# Patient Record
Sex: Female | Born: 1935 | Race: White | Hispanic: No
Health system: Southern US, Community
[De-identification: ages and names within clinical notes are randomized; demographics above are authoritative.]

---

## 2003-12-06 ENCOUNTER — Other Ambulatory Visit: Payer: Self-pay

## 2005-10-14 ENCOUNTER — Emergency Department: Payer: Self-pay | Admitting: Emergency Medicine

## 2005-10-15 ENCOUNTER — Other Ambulatory Visit: Payer: Self-pay

## 2011-06-06 ENCOUNTER — Ambulatory Visit: Payer: Self-pay | Admitting: Family Medicine

## 2011-06-10 ENCOUNTER — Ambulatory Visit: Payer: Self-pay | Admitting: Vascular Surgery

## 2013-03-05 ENCOUNTER — Other Ambulatory Visit: Payer: Self-pay | Admitting: Ophthalmology

## 2013-03-05 LAB — SEDIMENTATION RATE: Erythrocyte Sed Rate: 38 mm/hr — ABNORMAL HIGH (ref 0–30)

## 2013-05-23 ENCOUNTER — Inpatient Hospital Stay: Payer: Self-pay | Admitting: Internal Medicine

## 2013-05-23 LAB — CBC
HCT: 34.4 % — ABNORMAL LOW (ref 35.0–47.0)
HGB: 11.5 g/dL — ABNORMAL LOW (ref 12.0–16.0)
MCH: 28.7 pg (ref 26.0–34.0)
Platelet: 277 10*3/uL (ref 150–440)
RBC: 3.99 10*6/uL (ref 3.80–5.20)
RDW: 14.1 % (ref 11.5–14.5)
WBC: 12.3 10*3/uL — ABNORMAL HIGH (ref 3.6–11.0)

## 2013-05-23 LAB — TROPONIN I: Troponin-I: 0.06 ng/mL — ABNORMAL HIGH

## 2013-05-23 LAB — BASIC METABOLIC PANEL
BUN: 34 mg/dL — ABNORMAL HIGH (ref 7–18)
Chloride: 100 mmol/L (ref 98–107)
EGFR (African American): 34 — ABNORMAL LOW
Osmolality: 270 (ref 275–301)

## 2013-05-24 LAB — BASIC METABOLIC PANEL
BUN: 24 mg/dL — ABNORMAL HIGH (ref 7–18)
Calcium, Total: 8.6 mg/dL (ref 8.5–10.1)
EGFR (African American): 45 — ABNORMAL LOW
EGFR (Non-African Amer.): 39 — ABNORMAL LOW
Glucose: 98 mg/dL (ref 65–99)
Potassium: 4.6 mmol/L (ref 3.5–5.1)

## 2013-05-24 LAB — CBC WITH DIFFERENTIAL/PLATELET
Basophil #: 0 10*3/uL (ref 0.0–0.1)
Eosinophil #: 0 10*3/uL (ref 0.0–0.7)
Eosinophil %: 0.3 %
HGB: 10.7 g/dL — ABNORMAL LOW (ref 12.0–16.0)
Lymphocyte #: 1.1 10*3/uL (ref 1.0–3.6)
Lymphocyte %: 10.7 %
MCHC: 33.4 g/dL (ref 32.0–36.0)
MCV: 86 fL (ref 80–100)
Monocyte %: 14.5 %
Neutrophil #: 7.9 10*3/uL — ABNORMAL HIGH (ref 1.4–6.5)
Neutrophil %: 74.1 %

## 2013-05-27 LAB — CBC WITH DIFFERENTIAL/PLATELET
Eosinophil #: 0 10*3/uL (ref 0.0–0.7)
Eosinophil %: 0 %
HCT: 30.1 % — ABNORMAL LOW (ref 35.0–47.0)
Lymphocyte #: 0.3 10*3/uL — ABNORMAL LOW (ref 1.0–3.6)
Lymphocyte %: 6.8 %
MCH: 28.4 pg (ref 26.0–34.0)
MCV: 84 fL (ref 80–100)
Monocyte #: 0.1 x10 3/mm — ABNORMAL LOW (ref 0.2–0.9)
Neutrophil #: 4 10*3/uL (ref 1.4–6.5)
Neutrophil %: 91.1 %
Platelet: 307 10*3/uL (ref 150–440)
RBC: 3.58 10*6/uL — ABNORMAL LOW (ref 3.80–5.20)
RDW: 13.7 % (ref 11.5–14.5)
WBC: 4.4 10*3/uL (ref 3.6–11.0)

## 2013-05-27 LAB — BASIC METABOLIC PANEL
Anion Gap: 7 (ref 7–16)
BUN: 21 mg/dL — ABNORMAL HIGH (ref 7–18)
Calcium, Total: 8.9 mg/dL (ref 8.5–10.1)
Chloride: 101 mmol/L (ref 98–107)
Creatinine: 1.14 mg/dL (ref 0.60–1.30)
EGFR (African American): 49 — ABNORMAL LOW
Osmolality: 271 (ref 275–301)
Potassium: 4.6 mmol/L (ref 3.5–5.1)
Sodium: 132 mmol/L — ABNORMAL LOW (ref 136–145)

## 2013-05-28 LAB — CULTURE, BLOOD (SINGLE)

## 2013-06-16 ENCOUNTER — Ambulatory Visit: Payer: Self-pay | Admitting: Family Medicine

## 2013-09-23 ENCOUNTER — Inpatient Hospital Stay: Payer: Self-pay | Admitting: Family Medicine

## 2013-09-23 ENCOUNTER — Ambulatory Visit: Payer: Self-pay | Admitting: Internal Medicine

## 2013-09-23 LAB — CBC
HCT: 37.2 % (ref 35.0–47.0)
HGB: 12.3 g/dL (ref 12.0–16.0)
MCH: 28.4 pg (ref 26.0–34.0)
MCHC: 33 g/dL (ref 32.0–36.0)
MCV: 86 fL (ref 80–100)
PLATELETS: 227 10*3/uL (ref 150–440)
RBC: 4.32 10*6/uL (ref 3.80–5.20)
RDW: 13.4 % (ref 11.5–14.5)
WBC: 9.4 10*3/uL (ref 3.6–11.0)

## 2013-09-23 LAB — COMPREHENSIVE METABOLIC PANEL
ALT: 17 U/L (ref 12–78)
Albumin: 3.1 g/dL — ABNORMAL LOW (ref 3.4–5.0)
Alkaline Phosphatase: 49 U/L
Anion Gap: 4 — ABNORMAL LOW (ref 7–16)
BILIRUBIN TOTAL: 0.4 mg/dL (ref 0.2–1.0)
BUN: 28 mg/dL — ABNORMAL HIGH (ref 7–18)
CALCIUM: 9.2 mg/dL (ref 8.5–10.1)
Chloride: 101 mmol/L (ref 98–107)
Co2: 26 mmol/L (ref 21–32)
Creatinine: 1.48 mg/dL — ABNORMAL HIGH (ref 0.60–1.30)
EGFR (Non-African Amer.): 31 — ABNORMAL LOW
GFR CALC AF AMER: 36 — AB
Glucose: 117 mg/dL — ABNORMAL HIGH (ref 65–99)
Osmolality: 269 (ref 275–301)
Potassium: 4.3 mmol/L (ref 3.5–5.1)
SGOT(AST): 22 U/L (ref 15–37)
SODIUM: 131 mmol/L — AB (ref 136–145)
Total Protein: 6.8 g/dL (ref 6.4–8.2)

## 2013-09-23 LAB — CK TOTAL AND CKMB (NOT AT ARMC)
CK, TOTAL: 34 U/L (ref 21–215)
CK-MB: 1 ng/mL (ref 0.5–3.6)

## 2013-09-23 LAB — TROPONIN I: Troponin-I: 0.03 ng/mL

## 2013-09-23 LAB — RAPID INFLUENZA A&B ANTIGENS (ARMC ONLY)

## 2013-09-24 LAB — CBC WITH DIFFERENTIAL/PLATELET
Basophil #: 0 10*3/uL (ref 0.0–0.1)
Basophil %: 0.5 %
Eosinophil #: 0 10*3/uL (ref 0.0–0.7)
Eosinophil %: 0 %
HCT: 37 % (ref 35.0–47.0)
HGB: 12.2 g/dL (ref 12.0–16.0)
LYMPHS ABS: 1.1 10*3/uL (ref 1.0–3.6)
Lymphocyte %: 13.7 %
MCH: 28.2 pg (ref 26.0–34.0)
MCHC: 33 g/dL (ref 32.0–36.0)
MCV: 85 fL (ref 80–100)
MONOS PCT: 10.9 %
Monocyte #: 0.8 x10 3/mm (ref 0.2–0.9)
Neutrophil #: 5.8 10*3/uL (ref 1.4–6.5)
Neutrophil %: 74.9 %
Platelet: 231 10*3/uL (ref 150–440)
RBC: 4.34 10*6/uL (ref 3.80–5.20)
RDW: 12.9 % (ref 11.5–14.5)
WBC: 7.8 10*3/uL (ref 3.6–11.0)

## 2013-09-24 LAB — BASIC METABOLIC PANEL
ANION GAP: 5 — AB (ref 7–16)
BUN: 26 mg/dL — ABNORMAL HIGH (ref 7–18)
CALCIUM: 9.1 mg/dL (ref 8.5–10.1)
CREATININE: 1.16 mg/dL (ref 0.60–1.30)
Chloride: 104 mmol/L (ref 98–107)
Co2: 27 mmol/L (ref 21–32)
EGFR (African American): 48 — ABNORMAL LOW
GFR CALC NON AF AMER: 41 — AB
GLUCOSE: 107 mg/dL — AB (ref 65–99)
Osmolality: 277 (ref 275–301)
POTASSIUM: 4.9 mmol/L (ref 3.5–5.1)
Sodium: 136 mmol/L (ref 136–145)

## 2013-09-25 LAB — CBC WITH DIFFERENTIAL/PLATELET
Basophil #: 0.1 10*3/uL (ref 0.0–0.1)
Basophil %: 0.3 %
Eosinophil #: 0 10*3/uL (ref 0.0–0.7)
Eosinophil %: 0 %
HCT: 37.8 % (ref 35.0–47.0)
HGB: 12.2 g/dL (ref 12.0–16.0)
LYMPHS ABS: 0.4 10*3/uL — AB (ref 1.0–3.6)
Lymphocyte %: 2.9 %
MCH: 27.9 pg (ref 26.0–34.0)
MCHC: 32.3 g/dL (ref 32.0–36.0)
MCV: 86 fL (ref 80–100)
Monocyte #: 0.9 x10 3/mm (ref 0.2–0.9)
Monocyte %: 6.2 %
Neutrophil #: 13.3 10*3/uL — ABNORMAL HIGH (ref 1.4–6.5)
Neutrophil %: 90.6 %
PLATELETS: 265 10*3/uL (ref 150–440)
RBC: 4.38 10*6/uL (ref 3.80–5.20)
RDW: 13.2 % (ref 11.5–14.5)
WBC: 14.7 10*3/uL — ABNORMAL HIGH (ref 3.6–11.0)

## 2013-09-25 LAB — BASIC METABOLIC PANEL
Anion Gap: 7 (ref 7–16)
BUN: 35 mg/dL — ABNORMAL HIGH (ref 7–18)
CALCIUM: 9 mg/dL (ref 8.5–10.1)
Chloride: 102 mmol/L (ref 98–107)
Co2: 25 mmol/L (ref 21–32)
Creatinine: 1.94 mg/dL — ABNORMAL HIGH (ref 0.60–1.30)
EGFR (African American): 26 — ABNORMAL LOW
GFR CALC NON AF AMER: 22 — AB
GLUCOSE: 153 mg/dL — AB (ref 65–99)
Osmolality: 279 (ref 275–301)
POTASSIUM: 5.4 mmol/L — AB (ref 3.5–5.1)
Sodium: 134 mmol/L — ABNORMAL LOW (ref 136–145)

## 2013-09-26 LAB — BASIC METABOLIC PANEL
Anion Gap: 5 — ABNORMAL LOW (ref 7–16)
BUN: 40 mg/dL — AB (ref 7–18)
CALCIUM: 8.4 mg/dL — AB (ref 8.5–10.1)
CHLORIDE: 100 mmol/L (ref 98–107)
CREATININE: 1.71 mg/dL — AB (ref 0.60–1.30)
Co2: 28 mmol/L (ref 21–32)
EGFR (African American): 30 — ABNORMAL LOW
GFR CALC NON AF AMER: 26 — AB
GLUCOSE: 97 mg/dL (ref 65–99)
Osmolality: 276 (ref 275–301)
POTASSIUM: 4.1 mmol/L (ref 3.5–5.1)
SODIUM: 133 mmol/L — AB (ref 136–145)

## 2013-09-26 LAB — CBC WITH DIFFERENTIAL/PLATELET
BASOS ABS: 0 10*3/uL (ref 0.0–0.1)
Basophil %: 0.3 %
EOS PCT: 0.1 %
Eosinophil #: 0 10*3/uL (ref 0.0–0.7)
HCT: 33.6 % — AB (ref 35.0–47.0)
HGB: 11.1 g/dL — ABNORMAL LOW (ref 12.0–16.0)
LYMPHS ABS: 1 10*3/uL (ref 1.0–3.6)
Lymphocyte %: 11.8 %
MCH: 28.2 pg (ref 26.0–34.0)
MCHC: 33.1 g/dL (ref 32.0–36.0)
MCV: 85 fL (ref 80–100)
MONOS PCT: 9.4 %
Monocyte #: 0.8 x10 3/mm (ref 0.2–0.9)
NEUTROS ABS: 6.6 10*3/uL — AB (ref 1.4–6.5)
Neutrophil %: 78.4 %
PLATELETS: 223 10*3/uL (ref 150–440)
RBC: 3.95 10*6/uL (ref 3.80–5.20)
RDW: 13.2 % (ref 11.5–14.5)
WBC: 8.5 10*3/uL (ref 3.6–11.0)

## 2013-09-26 LAB — PRO B NATRIURETIC PEPTIDE: B-Type Natriuretic Peptide: 20285 pg/mL — ABNORMAL HIGH (ref 0–450)

## 2013-09-27 LAB — BASIC METABOLIC PANEL
ANION GAP: 3 — AB (ref 7–16)
BUN: 33 mg/dL — ABNORMAL HIGH (ref 7–18)
CREATININE: 1.54 mg/dL — AB (ref 0.60–1.30)
Calcium, Total: 8.9 mg/dL (ref 8.5–10.1)
Chloride: 97 mmol/L — ABNORMAL LOW (ref 98–107)
Co2: 31 mmol/L (ref 21–32)
EGFR (African American): 34 — ABNORMAL LOW
EGFR (Non-African Amer.): 29 — ABNORMAL LOW
GLUCOSE: 81 mg/dL (ref 65–99)
Osmolality: 269 (ref 275–301)
POTASSIUM: 3.8 mmol/L (ref 3.5–5.1)
Sodium: 131 mmol/L — ABNORMAL LOW (ref 136–145)

## 2013-09-27 LAB — CBC WITH DIFFERENTIAL/PLATELET
BASOS ABS: 0.1 10*3/uL (ref 0.0–0.1)
BASOS PCT: 0.7 %
Eosinophil #: 0 10*3/uL (ref 0.0–0.7)
Eosinophil %: 0.4 %
HCT: 35.3 % (ref 35.0–47.0)
HGB: 11.6 g/dL — AB (ref 12.0–16.0)
LYMPHS ABS: 1.2 10*3/uL (ref 1.0–3.6)
Lymphocyte %: 16.1 %
MCH: 28.4 pg (ref 26.0–34.0)
MCHC: 33 g/dL (ref 32.0–36.0)
MCV: 86 fL (ref 80–100)
MONO ABS: 0.9 x10 3/mm (ref 0.2–0.9)
Monocyte %: 11 %
Neutrophil #: 5.6 10*3/uL (ref 1.4–6.5)
Neutrophil %: 71.8 %
PLATELETS: 256 10*3/uL (ref 150–440)
RBC: 4.1 10*6/uL (ref 3.80–5.20)
RDW: 13.1 % (ref 11.5–14.5)
WBC: 7.8 10*3/uL (ref 3.6–11.0)

## 2013-09-28 LAB — BASIC METABOLIC PANEL
ANION GAP: 1 — AB (ref 7–16)
BUN: 32 mg/dL — AB (ref 7–18)
Calcium, Total: 9.1 mg/dL (ref 8.5–10.1)
Chloride: 97 mmol/L — ABNORMAL LOW (ref 98–107)
Co2: 33 mmol/L — ABNORMAL HIGH (ref 21–32)
Creatinine: 1.23 mg/dL (ref 0.60–1.30)
GFR CALC AF AMER: 45 — AB
GFR CALC NON AF AMER: 39 — AB
Glucose: 90 mg/dL (ref 65–99)
OSMOLALITY: 269 (ref 275–301)
Potassium: 4.3 mmol/L (ref 3.5–5.1)
Sodium: 131 mmol/L — ABNORMAL LOW (ref 136–145)

## 2013-09-28 LAB — CULTURE, BLOOD (SINGLE)

## 2013-09-29 LAB — BASIC METABOLIC PANEL
ANION GAP: 2 — AB (ref 7–16)
BUN: 30 mg/dL — AB (ref 7–18)
CALCIUM: 9.2 mg/dL (ref 8.5–10.1)
CHLORIDE: 97 mmol/L — AB (ref 98–107)
Co2: 35 mmol/L — ABNORMAL HIGH (ref 21–32)
Creatinine: 1.18 mg/dL (ref 0.60–1.30)
EGFR (African American): 47 — ABNORMAL LOW
EGFR (Non-African Amer.): 41 — ABNORMAL LOW
Glucose: 88 mg/dL (ref 65–99)
Osmolality: 274 (ref 275–301)
Potassium: 4.7 mmol/L (ref 3.5–5.1)
SODIUM: 134 mmol/L — AB (ref 136–145)

## 2013-09-29 LAB — EXPECTORATED SPUTUM ASSESSMENT W GRAM STAIN, RFLX TO RESP C

## 2013-09-30 LAB — CULTURE, BLOOD (SINGLE)

## 2013-10-24 ENCOUNTER — Ambulatory Visit: Payer: Self-pay | Admitting: Internal Medicine

## 2013-10-24 DEATH — deceased

## 2014-04-27 IMAGING — CT CT CHEST W/O CM
1 of 2 series · 14 of 32 positions shown, 18 images · non-contrast
Comparison: none

REASON FOR EXAM: Abn Chest Xray
COMMENTS:

PROCEDURE:     KCT - KCT CHEST WITHOUT CONTRAST  - June 16, 2013  [DATE]
RESULT:
TECHNIQUE: Helical noncontrast 3 mm sections were obtained from the
thoracic inlet through the lung bases.

[Series 2: chest w/o 3.0 i31f 2 · axial · non-contrast · 0.67mm/px · z∈[-109,+128]mm · 14 of 95 slices shown, 18 images]
[im 8/95  mediastinal]
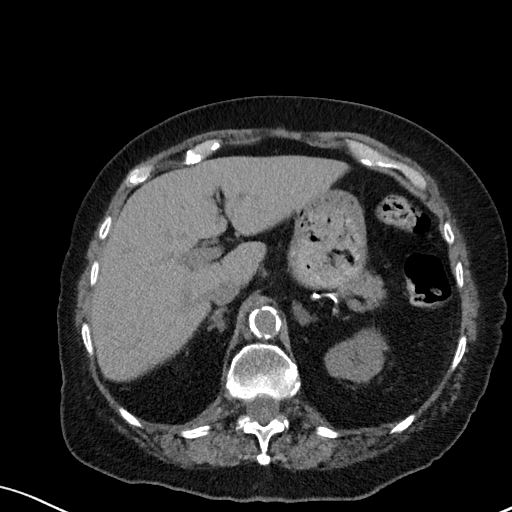
[im 8/95  lung]
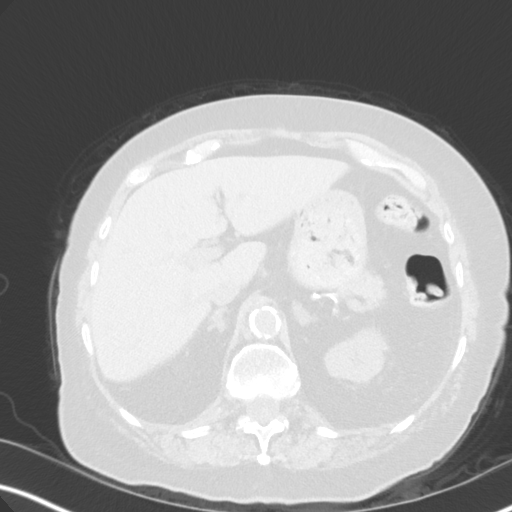
[im 15/95  lung]
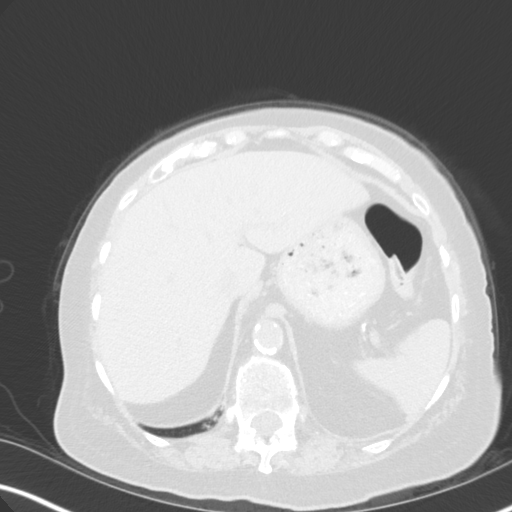
[im 22/95  lung]
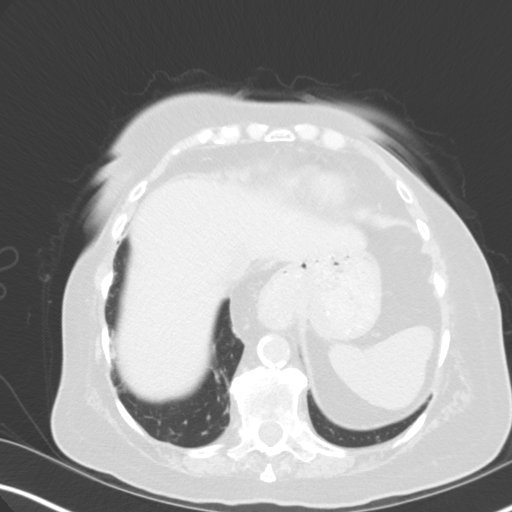
[im 29/95  lung]
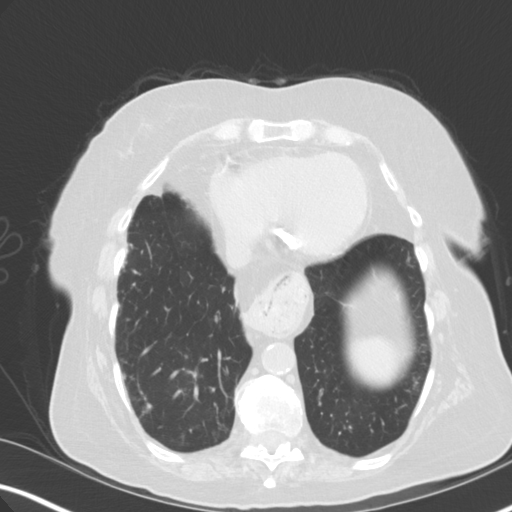
[im 37/95  mediastinal]
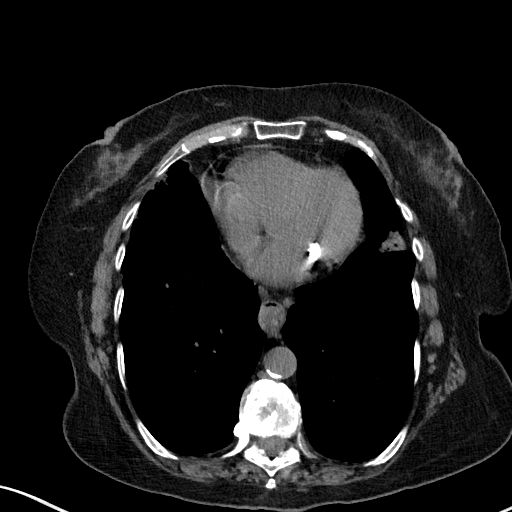
[im 37/95  lung]
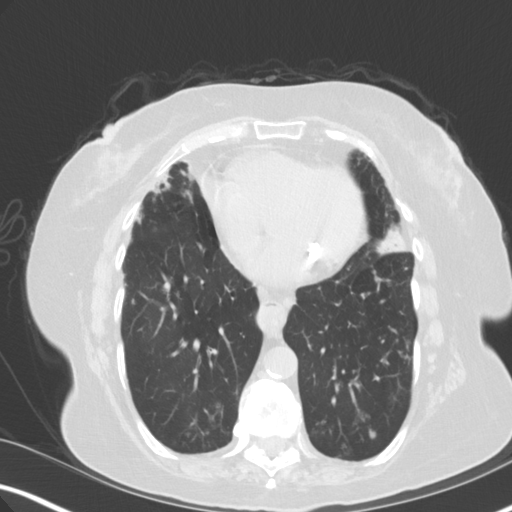
[im 44/95  lung]
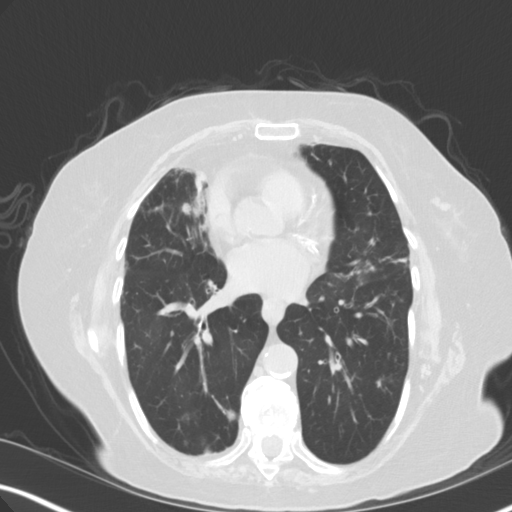
[im 45/95  lung]
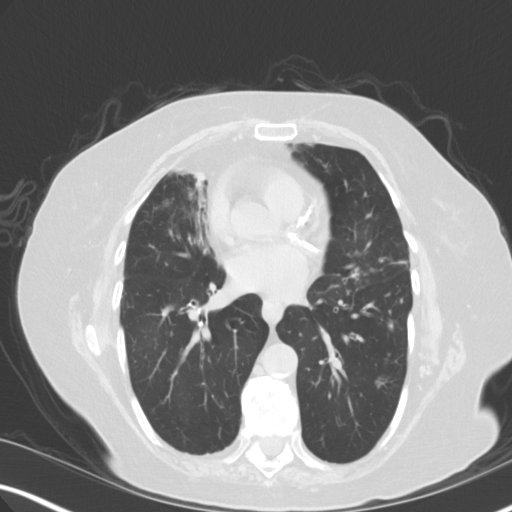
[im 48/95  lung]
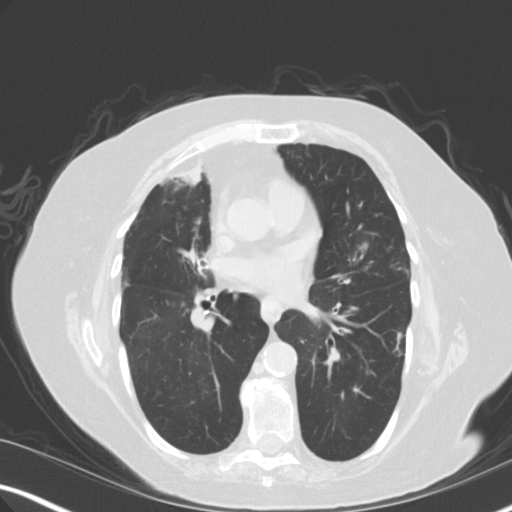
[im 51/95  mediastinal]
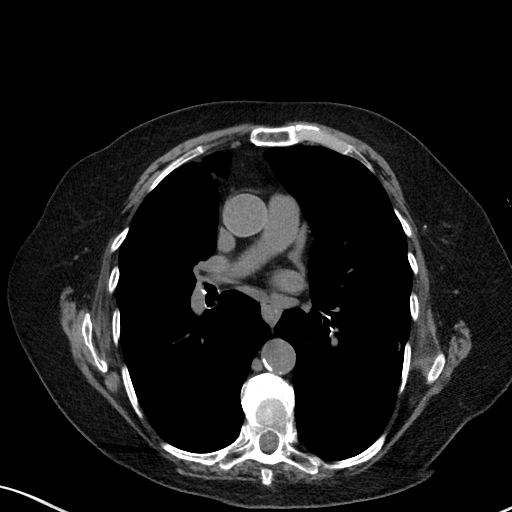
[im 51/95  lung]
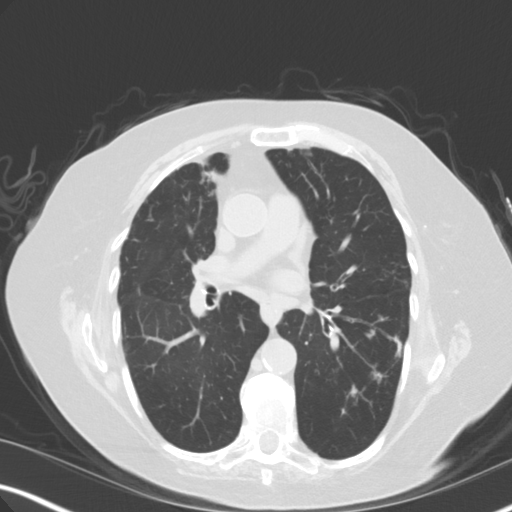
[im 58/95  lung]
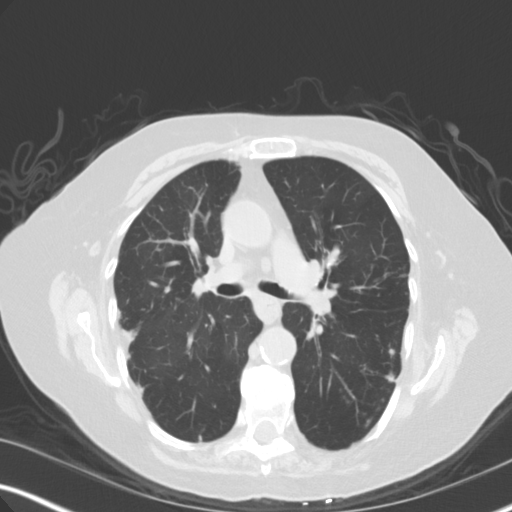
[im 66/95  lung]
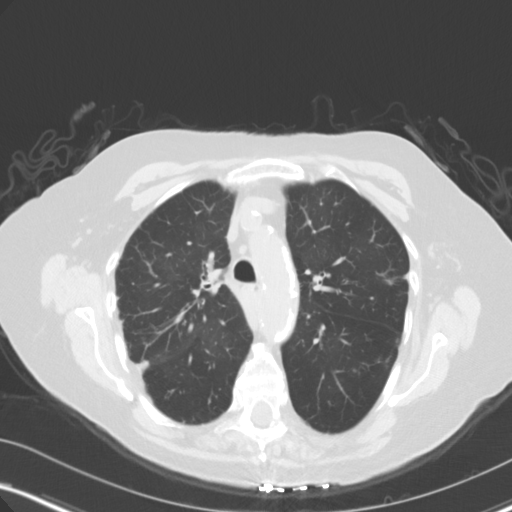
[im 73/95  lung]
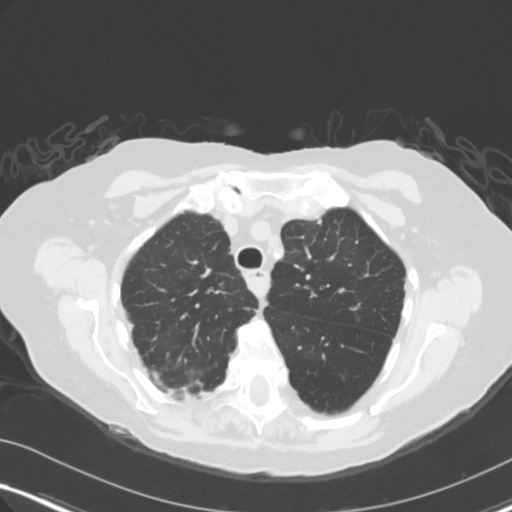
[im 80/95  mediastinal]
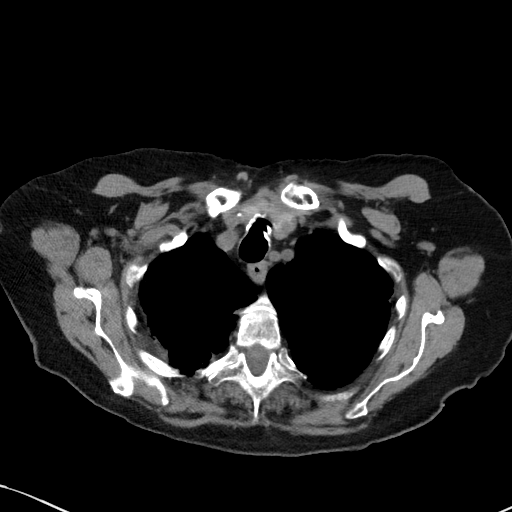
[im 80/95  lung]
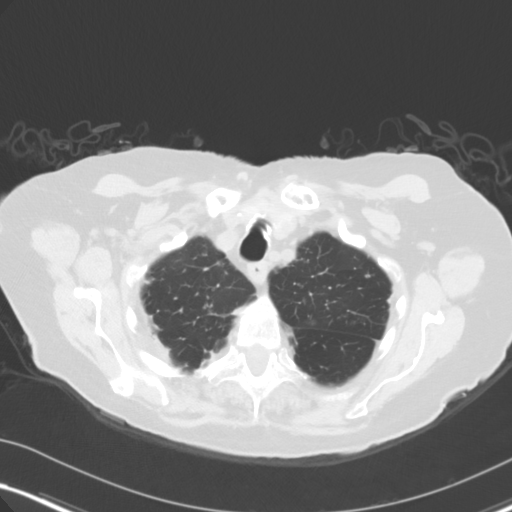
[im 87/95  lung]
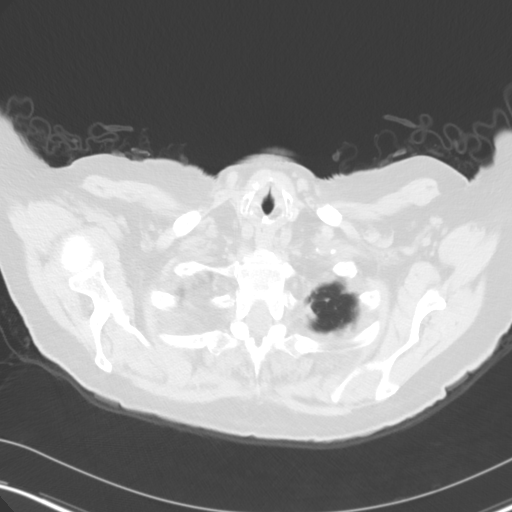

[14 of 32 positions shown; findings below may reference images not displayed]

FINDINGS: Evaluation of the mediastinum and hilar regions and structures
demonstrates small, subcentimeter lymph nodes within the precarinal region
and within the prevascular space. The lung parenchyma demonstrates
emphysematous and interstitial changes throughout both lungs. There are
areas of subpleural septal thickening and mild interlobular septal
thickening. Multiple pulmonary nodules are identified within right and left
hemithoraces. There are also multifocal areas of pleural thickening. A
dominant area is identified along the lateral base of the lingula on image
#58 of the lung series, measuring 2.31 x 1.66 cm. An area is identified
along the anterior aspect of the cardiophrenic region measuring 1.8 x
cm on image #48 of the lung series. Pulmonary nodules are identified within
the right and left lung bases. A dominant nodule is identified in the
posterior medial segment of the left lower lobe on image #60 and measuring
6.2 mm. A nodule is identified within the medial basal segment of the right
lower lobe on image #54 and this has a more ground-glass appearance
measuring 8.1 mm.

The visualized upper abdominal viscera demonstrate no gross abnormalities.
IMPRESSION: 1.     Interstitial and emphysematous changes as described above.
2.     Areas of pleural plaque are appreciated. Multiple pulmonary nodules
are identified within the right and left hemithoraces. Surveillance
evaluation is recommended every four to six months over a two year period.
If clinically warranted, Pulmonology consultation is recommended.

## 2015-01-13 NOTE — H&P (Signed)
PATIENT NAME:  Tiffany Bennett, Tiffany Bennett MR#:  [id number] DATE OF BIRTH:  01-10-1921  DATE OF ADMISSION:  05/23/2013  CHIEF COMPLAINT:  Shortness of breath.   HISTORY OF PRESENT ILLNESS:  This is a 81 year old female who lives in a care home. She started noticing yesterday she got short of breath, started to cough, coughing every time she speaks, hard to catch her breath. She has had no fever or chills that she knows of. She has been around some sick contacts at the care home. She comes in and found to be hypoxic. Currently she is on a 100% non-rebreather, satting in the mid-90s. She can answer questions, but it is difficult for her to do complete sentences because of the shortness of breath and the cough. Her daughter is also at the bedside, and gives me same history.   PAST MEDICAL HISTORY: 1.  COPD.   2.  GERD.   3.  Hypertension.   PAST SURGICAL HISTORY:  Appendectomy.   ALLERGIES:  CIPRO and GEMIFLOXACIN.   CURRENT MEDICATIONS:  Advair 250/50, 1 puff b.i.d. Albuterol nebulizers q. 4 hours p.r.n.  Imodium 2 mg q. 12 hours p.r.n. Ipratropium nebulizers q. 6 hours p.r.n. Lansoprazole 30 mg daily. Lisinopril 20 mg daily. ProAir 2 puffs q. 6 p.r.n. Tylenol 650 q. 4 p.r.n.   SOCIAL HISTORY:  She does not smoke, does not drink alcohol. Lives at a care home.   FAMILY HISTORY: Significant for coronary artery disease.   REVIEW OF SYSTEMS:  Difficult to obtain from the patient because of the shortness of breath.  CONSTITUTIONAL: No fever or chills.  EYES: No blurred vision.  ENT: No hearing loss.  CARDIOVASCULAR: No chest pain.  PULMONARY: She has shortness of breath and cough.  GASTROINTESTINAL: No nausea or vomiting.  GENITOURINARY:  No dysuria.  ENDOCRINE: No heat or cold intolerance.  INTEGUMENT: No rash.  MUSCULOSKELETAL: Occasional joint pain.  NEUROLOGIC: No numbness or weakness.   VITAL SIGNS: Temperature is 99.9, pulse 109, respirations 20, blood pressure 183/66.   PHYSICAL  EXAMINATION: GENERAL: This is a well-nourished white female who is in some moderate respiratory distress.  HEENT: The pupils are equal, round, reactive to light. Sclerae are nonicteric. Oral mucosa is dry. Oropharynx is clear. Nasopharynx is clear.  NECK: Supple. No JVD, lymphadenopathy or thyromegaly.  CARDIOVASCULAR: Tachy, with no murmurs.  LUNGS: There are scattered rhonchi, some mild expiratory wheezing. No dullness to percussion. She is using accessory muscles.  ABDOMEN: Soft, nontender, nondistended. Bowel sounds positive. No hepatosplenomegaly. No masses.  EXTREMITIES: There is trace lower extremity edema.  NEUROLOGIC: Cranial nerves II through XII are intact. She is hard of hearing. There is no decrease in sensation bilaterally in the upper and lower extremities.  SKIN: Moist, with no rash.   DATA: Chest x-ray shows no infiltrates. White blood cells are 12.3, hemoglobin 11.5, BUN is 34, creatinine 1.56, sodium 131.   ASSESSMENT AND PLAN: 1.  Acute respiratory failure. The patient is requiring O2 and is in some moderate respiratory distress. Will keep her on 100% non-rebreather, wean her as tolerated, and treat her underlying cause.  She is a DO NOT RESUSCITATE and DO NOT INTUBATE, so patient and family do not want intubation if it comes to that.   2.  Acute renal failure. I do not have a baseline on her, but she does appear to be clinically dehydrated. Will give her IV fluids and renal dose medication.   3. Chronic obstructive pulmonary disease exacerbation. She is going  to be on frequent nebulizers. I am going to change her to Xopenex for less cardiac stimulation. I do not see a need for steroids at this time.   4.  Bronchitis. I believe she has some severe bronchitis. I do not see pneumonia on chest x-ray. Will start her on some Zithromax and Rocephin, get blood and sputum cultures.   5.  Code status. She does have advance directive with DNR on her chart from the care home. I  discussed this with the patient and her daughter. They both confirmed this, so she will be a DO NOT RESUSCITATE.  Time spent on admission was 60 minutes.    ____________________________ Gracelyn NurseJohn D. Amy Belloso, MD jdj:mr D: 05/23/2013 16:39:27 ET T: 05/23/2013 18:12:55 ET JOB#: [id number]  cc: Gracelyn NurseJohn D. Deepa Barthel, MD, <Dictator> Marina Goodellale E. Feldpausch, MD   Gracelyn NurseJOHN D Sire Poet MD ELECTRONICALLY SIGNED 05/23/2013 19:23

## 2015-01-13 NOTE — Discharge Summary (Signed)
PATIENT NAME:  Tiffany Bennett, Tiffany Bennett MR#:  [id number] DATE OF BIRTH:  13-Jul-1921  DATE OF ADMISSION:  05/23/2013 DATE OF DISCHARGE:  05/26/2013  PRIMARY CARE PHYSICIAN: Maudie Flakesale Feldpausch, M.D.  DISCHARGE DIAGNOSES: 1. Left lower lobe pneumonia.  2. Acute respiratory failure.  3. Chronic obstructive pulmonary disease exacerbation.  4. Chronic hyponatremia.  5. Gastroesophageal reflux disease.  6. Hypertension.  7. Generalized weakness.   IMAGING STUDIES: Include a chest x-ray which showed left lower lobe pneumonia with small synpneumonic effusion.   CONSULTS: None.   ADMITTING HISTORY AND PHYSICAL: Please see detailed H and P dictated previously by Dr. Letitia LibraJohnston. In brief, a 81 year old female patient who lives in a care home presented to the hospital complaining of shortness of breath and cough. The patient was found to have COPD exacerbation and left lower lobe pneumonia and admitted to the hospitalist service.   HOSPITAL COURSE: Acute respiratory failure. The patient was initially on a nonrebreather, started on antibiotics and nebulizers in the hospital along with steroids with which he improved well. Chest x-ray did show left lower lobe pneumonia and antibiotics were started at the time of admission.  The patient did have fever during the hospital stay, but no fever during the last 24 hours. Is on room air doing well.   The patient was seen by physical therapy and ambulated well in the hallway with a walker, which is her baseline and is being discharged back to her care home.   I have discussed the plan with the daughter prior to discharge.   Today on exam, the patient's lungs sound clear without any wheezing or crackles. Good air entry on both sides. No edema. Temperature is 98.5, blood pressure 116/57 and saturating 94% on room air.   DISCHARGE MEDICATIONS: Include:  1. Lansoprazole 30 mg daily.  2. Lisinopril 20 mg daily.  3. Advair Diskus 250/50, 1 puff inhaled 2 times a day.  4.  Albuterol nebulizer every four hours as needed for shortness of breath.  5. Ipratropium nebulizer every six hours as needed for shortness of breath.  6. ProAir HFA 2 puffs inhaled every six hours as needed for shortness of breath.  7. Tylenol 325 mg 2 tablets every four hours as needed for pain and fever.  8. Imodium 2 mg oral up to four doses as needed in 12 hours for diarrhea.  9. Guaifenesin 5 mg oral every six hours as needed for cough.  10. Cefuroxime 500 mg oral 2 times a day for five days.  11. Azithromycin 500 mg oral once a day for five days.  12. Prednisone 60 mg tapered by 10 mg over six days.   DISCHARGE INSTRUCTIONS: No added salt.   ACTIVITY: As tolerated with assistance using a walker.   Follow up with primary care physician in a week.   ____________________________ Molinda BailiffSrikar R. Brandn Mcgath, MD srs:sg D: 05/26/2013 12:18:17 ET T: 05/26/2013 13:11:51 ET JOB#: [id number]   Addendum  Pt seen and examined on 05/27/2013. Her discharge was held on 03/25/2013 due to febrile episode. Antibiotic changed to zosyn. Pt has had no further fever. Feels back to baseline.  No weezing on lung exam. Temp -98.7 f At d/Bennett zugmentin and azithromycin prescribed.  Time spent on 05/27/2013 on disharge activity was 35 minutes or more  cc: Ugo Thoma R. Elpidio AnisSudini, MD, <Dictator> Marina Goodellale E. Feldpausch, MD  Orie FishermanSRIKAR R Darald Uzzle MD ELECTRONICALLY SIGNED 05/27/2013 20:39

## 2015-01-14 NOTE — Discharge Summary (Signed)
PATIENT NAME:  Tiffany Bennett, Tiffany Bennett MR#:  [id number] DATE OF BIRTH:  1921/07/21  DATE OF ADMISSION:  09/23/2013 DATE OF DISCHARGE:  09/29/2013  REASON FOR ADMISSION: Shortness of breath and fever.   DISCHARGE DIAGNOSES: 1.  Acute respiratory failure.  2.  Chronic obstructive pulmonary disease.  3.  Community-acquired pneumonia, right lower lobe.  4.  Acute on chronic renal failure.      DISPOSITION: Hospice home.   MEDICATIONS AT DISCHARGE:  Tylenol as needed for pain, albuterol with Atrovent inhalations q.4 hours as needed for shortness of breath, docusate 100 mg twice daily, guaifenesin 5 mg every 6 hours, lorazepam 2.5 mg 1 to 2 every 2 to 4 hours, morphine 20 mg/mL take 0.25 to 0.5 mL every 1 to 2 hours as needed for pain and comfort.   HOSPITAL COURSE: This is a very nice 81 year old female who has history of COPD,  GERD and hypertension who was admitted to the hospital on 09/23/2013 with a history of coming from the [facility] in [place] with 2 to 3 days of nasal stuffiness, cough and increased shortness of breath. The patient continued to get worse, started getting shortness of breath with low-grade fever for what she was brought to the Emergency Department Emergency. In the Emergency Department, her oxygen saturation was 90% on room air. Chest x-ray showed pneumonia of the right lower lobe, for what she was admitted.   As far as her medical problems, the patient developed an infiltrate compatible with pneumonia, for what she was started on ceftriaxone and azithromycin.  The patient started getting really weak and short of breath for what rapid response was called on January 3rd. The patient was found to be a little bit worse, increased shortness of breath. Lasix was given x 1. The following day her kidney function has decreased significantly but she was starting to improve.   As far as her acute respiratory failure, it is most likely secondary to chronic  obstructive pulmonary disease, pneumonia and on top of that CHF as the patient has significant fluid overload. The patient does not have history of previous CHF but seems to have pattern of congestive heart failure due to diastolic dysfunction due to fluid overload. The patient had a slight modest improvement 2 days after the admission  but then started to have significant deterioration, sats were looking worse. Palliative care consultation was obtained previously and the family opted to stop any treatment and transition to hospice home.   The patient is discharged to hospice home on 09/29/2013 in good condition. I had a conversation with the family and with palliative care services. The patient was in significant distress, breathing really hard, significant congestion with mucous rales on auscultation. Her abdomen was soft.  Her skin was very dry. Her skin was also pale with signs of decreased perfusion. The patient is definitely getting weaker, transferred to hospice home.   I spent about 35 minutes with this discharge today.   ____________________________ Felipa Furnaceoberto Sanchez Gutierrez, MD rsg:cs D: 09/29/2013 17:19:59 ET T: 09/29/2013 20:29:21 ET JOB#: [id number]  cc: Felipa Furnaceoberto Sanchez Gutierrez, MD, <Dictator> Tymeir Weathington Juanda ChanceSANCHEZ GUTIERRE MD ELECTRONICALLY SIGNED 10/17/2013 8:10

## 2015-01-14 NOTE — H&P (Signed)
PATIENT NAME:  Tiffany Bennett, Tiffany Bennett MR#:  [id number] DATE OF BIRTH:  [birth year]  DATE OF ADMISSION:  09/23/2013  PRIMARY CARE PHYSICIAN: Dr. Maryjane HurterFeldpausch  REFERRING EMERGENCY ROOM PHYSICIAN:  Dr. Minna AntisKevin Paduchowski   CHIEF COMPLAINT: Shortness of breath and fever.   HISTORY OF PRESENT ILLNESS: This is a 81 year old female with past history of chronic obstructive pulmonary disease and hypertension who lives in [facility] [place]. For the last 2 to 3 days she started having complaint of some nasal stuffiness and some cough so daughter took her to primary care physician's office and she was seen by a nurse practitioner and given some nasal spray and sent back home. After that in the last 2 days, she started getting worse. Her cough got worse and she was mild short of breath, also with low-grade fever and so today daughter decided to take her to the walk-in clinic, and after seeing over there they advised to take her directly to the Emergency Room because they felt she is very sick and most likely having pneumonia. On workup in the hospital, she was found having hypoxia up to 90% to 91% oxygen saturation on room air and chest x-ray showed some pneumonia on the right lower lobe so she is given to hospitalist team for further management of this issue. On questioning, the patient is still alert and oriented and she denies any other complaint other than cough and mild shortness of breath. More history obtained from the patient's daughter, who is also present in the room, Tiffany Bennett. She is healthcare power of attorney.  REVIEW OF SYSTEMS:   CONSTITUTIONAL: Positive for low-grade fever and fatigue. No pain or weight loss.  EYES: No blurring, double vision, discharge or redness.  EARS, NOSE, THROAT: No tinnitus, ear pain or hearing loss.  RESPIRATORY: Has some cough but no wheezing, also has some shortness of breath.  CARDIOVASCULAR: No chest pain, orthopnea, edema or  palpitations.  GASTROINTESTINAL: No nausea, vomiting, diarrhea, abdominal pain.  GENITOURINARY: No dysuria, hematuria or increased frequency.  ENDOCRINE: No increased sweating. No heat or cold intolerance.  SKIN: No acne, rashes or lesions.  MUSCULOSKELETAL: No pain or swelling in the joints.  NEUROLOGICAL: No numbness, weakness, tremors or vertigo.  PSYCHIATRIC: No anxiety, insomnia, bipolar disorder.   PAST MEDICAL HISTORY: 1.  COPD. 2.  Gastroesophageal reflux disease.  3.  Hypertension.   PAST SURGICAL HISTORY: Appendectomy.   ALLERGIES: CIPROFLOXACIN AND LEVAQUIN.   SOCIAL HISTORY: She does not smoke, no drinking alcohol. Lives at assisted-living facility, currently [facility]. She walks with a walker and just mild memory issues, but does not have dementia.   FAMILY HISTORY: Positive for coronary artery disease.   HOME MEDICATIONS: 1.  Tylenol 325 mg oral 2 tablets every 4 hours as needed.  2.  ProAir inhaler 2 puffs every 6 hours.  3.  Prednisone 10 mg oral tablet, started at 60 and taper by 10 mg, which was given in the past; currently, she is not taking.  4.  Lisinopril 20 mg once a day.  5.  Lansoprazole 30 mg oral once a day.  6.  Ipratropium inhalation once a day.   PHYSICAL EXAMINATION: VITAL SIGNS: In ER, temperature 99.6, pulse 96, respirations 18, blood pressure 146/53 and pulse oximetry 90% on room air.  GENERAL: The patient is alert and oriented to time, place and person, appears slightly weak but cooperative with history taking and physical examination.  HEENT:  Head and neck atraumatic. Conjunctivae pink.  Oral mucosa moist.  NECK: Supple. No JVD.  RESPIRATORY: Bilateral equal air entry. Some crepitation heard on the right side. No wheezing.  CARDIOVASCULAR: S1, S2 present, regular. Murmur present.  ABDOMEN: Soft, nontender. Bowel sounds present. No organomegaly.  SKIN: No rashes. LEGS: No edema.  NEUROLOGICAL: Power 4/5, generalized weakness. Moves all 4  limbs and follows commands. JOINTS: No swelling or tenderness  PSYCHIATRIC: Does not appear in any acute psychiatric illness at this time.  IMPORTANT LABORATORY RESULTS:  Glucose 117, BUN 28, creatinine 1.48, sodium 131, potassium 4.3, chloride 101, CO2 26, calcium 9.2. Total protein 6.8, albumin 3.1, bilirubin 0.4, alkaline phosphatase 49, SGOT 22, SGPT 17. Troponin 0.03. WBC 9.4, hemoglobin 12.3, platelet count 227, MCV 86. Influenza A and B are negative.  Chest x-ray, PA and lateral: There is hazy airspace disease in right lung base, concerning for pneumonia.   ASSESSMENT AND PLAN: A 81 year old female with past medical history of chronic obstructive pulmonary disease and hypertension who started having shortness of breath and some cough with low-grade fever for the last 2 to 3 days and so sent to the hospital Emergency Room, found having some pneumonia in right lower lobe.  1.  Acute respiratory failure requiring oxygen supplementation. Currently, this pneumonia probably due to pneumonia. Chronic obstructive pulmonary disease is at baseline. No wheezing. Will continue treating the underlying cause. 2.  Pneumonia. We will give her ceftriaxone and azithromycin IV. Sputum culture and blood culture. Influenza test is negative. 3.  Acute on chronic renal failure. There is some worsening in the kidney function, most likely it is because of pneumonia. We will give gentle IV hydration and follow it tomorrow.  4.  Chronic obstructive pulmonary disease. It is at baseline. Currently, there is no wheezing. We will continue nebulizer treatment as she is taking at home.  5.  Gastroesophageal reflux disease. We will continue pantoprazole. Daily prophylaxis with heparin subcutaneous and we will get physical therapy evaluation for her overall generalized little weakness for the last 2 to 3 days, this might be because of sickness, to decide the level of care she might need after discharge.  CODE STATUS:  DO NOT  RESUSCITATE. Confirmed with her daughter, who is also power of attorney, [name].  The patient is sent to ER with a portable DO NOT RESUSCITATE form signed by her primary care physician.  TOTAL TIME SPENT ON THIS ADMISSION: 50 minutes    ____________________________ Hope Pigeon Elisabeth Pigeon, MD vgv:ce D: 09/23/2013 11:55:45 ET T: 09/23/2013 12:07:47 ET JOB#: [id number]  cc: Hope Pigeon. Elisabeth Pigeon, MD, <Dictator> Altamese Dilling MD ELECTRONICALLY SIGNED 09/29/2013 13:53
